# Patient Record
Sex: Male | Born: 2000 | Race: White | Hispanic: No | Marital: Single | Smoking: Never smoker
Health system: Southern US, Community
[De-identification: ages and names within clinical notes are randomized; demographics above are authoritative.]

## PROBLEM LIST (undated history)

## (undated) DIAGNOSIS — J189 Pneumonia, unspecified organism: Secondary | ICD-10-CM

## (undated) DIAGNOSIS — J45909 Unspecified asthma, uncomplicated: Secondary | ICD-10-CM

## (undated) DIAGNOSIS — J40 Bronchitis, not specified as acute or chronic: Secondary | ICD-10-CM

## (undated) HISTORY — DX: Bronchitis, not specified as acute or chronic: J40

## (undated) HISTORY — PX: ADENOIDECTOMY: SUR15

## (undated) HISTORY — DX: Pneumonia, unspecified organism: J18.9

## (undated) HISTORY — PX: MYRINGOTOMY WITH TUBE PLACEMENT: SHX5663

## (undated) HISTORY — PX: TONSILLECTOMY: SUR1361

---
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
## 2022-05-03 ENCOUNTER — Other Ambulatory Visit (HOSPITAL_COMMUNITY): Payer: Self-pay

## 2022-05-03 MED ORDER — CELECOXIB 200 MG PO CAPS
200.0000 mg | ORAL_CAPSULE | Freq: Every day | ORAL | 1 refills | Status: AC
  Filled 2022-05-03: qty 30, 30d supply, fill #0
